# Patient Record
Sex: Male | Born: 1965 | Race: Black or African American | Hispanic: No | Marital: Married | Smoking: Never smoker
Health system: Southern US, Community
[De-identification: ages and names within clinical notes are randomized; demographics above are authoritative.]

## PROBLEM LIST (undated history)

## (undated) ENCOUNTER — Ambulatory Visit (HOSPITAL_COMMUNITY): Payer: No Typology Code available for payment source

## (undated) DIAGNOSIS — E785 Hyperlipidemia, unspecified: Secondary | ICD-10-CM

## (undated) DIAGNOSIS — I10 Essential (primary) hypertension: Secondary | ICD-10-CM

## (undated) HISTORY — DX: Hyperlipidemia, unspecified: E78.5

## (undated) HISTORY — DX: Essential (primary) hypertension: I10

---

## 1999-05-20 ENCOUNTER — Emergency Department (HOSPITAL_COMMUNITY): Admission: EM | Admit: 1999-05-20 | Discharge: 1999-05-20 | Payer: Self-pay | Admitting: Emergency Medicine

## 1999-05-20 ENCOUNTER — Encounter: Payer: Self-pay | Admitting: General Surgery

## 2001-06-29 ENCOUNTER — Emergency Department (HOSPITAL_COMMUNITY): Admission: EM | Admit: 2001-06-29 | Discharge: 2001-06-29 | Payer: Self-pay | Admitting: Emergency Medicine

## 2001-11-18 ENCOUNTER — Encounter: Payer: Self-pay | Admitting: Emergency Medicine

## 2001-11-18 ENCOUNTER — Emergency Department (HOSPITAL_COMMUNITY): Admission: EM | Admit: 2001-11-18 | Discharge: 2001-11-18 | Payer: Self-pay | Admitting: Emergency Medicine

## 2006-10-18 ENCOUNTER — Ambulatory Visit (HOSPITAL_BASED_OUTPATIENT_CLINIC_OR_DEPARTMENT_OTHER): Admission: RE | Admit: 2006-10-18 | Discharge: 2006-10-18 | Payer: Self-pay | Admitting: Psychiatry

## 2006-10-30 ENCOUNTER — Ambulatory Visit: Payer: Self-pay | Admitting: Internal Medicine

## 2010-08-18 NOTE — Procedures (Signed)
[name], [name]              ACCOUNT NO.:  [id number]   MEDICAL RECORD NO.:  [id number]          PATIENT TYPE:  OUT   LOCATION:  SLEEP CENTER                 FACILITY:  [facility]   PHYSICIAN:  Clinton D. Maple Hudson, MD, FCCP, FACPDATE OF BIRTH:  February 27, 1967   DATE OF STUDY:  10/18/2006                            NOCTURNAL POLYSOMNOGRAM   REFERRING PHYSICIAN:  Anne Fu, PA-C   INDICATION FOR STUDY:  Hypersomnia with sleep apnea.   EPWORTH SLEEPINESS SCORE:  11/24, BMI 30.3, weight 205 pounds.   MEDICATIONS:  Listed and reviewed.   SLEEP ARCHITECTURE:  Short total sleep time 172 minutes, sleep  efficiency 57%.  Stage I was 15%, stage II 54%, stage III and IV 27%,  RAM 6% of total sleep time.  Sleep latency 2 minutes, REM latency 44  minutes.  Awake after sleep onset 93 minutes.  Arousal index 25.5.  No  bedtime medication was taken.   RESPIRATORY DATA:  Diagnostic NPSG protocol requested.  Apnea/hypopnea  index (AHI, RDI), 3.5 obstructive events per hour which is within normal  limits (normal range 0-5 per hour).  There were 7 hypopneas and 3  obstructive apneas.  Events were associated with sleep in the supine and  left lateral positions.  REM AHI 18.9.   OXYGEN DATA:  Very loud snoring with oxygen desaturation to a nadir of  86%.  Mean oxygen saturation through the study was 96% on room air.   CARDIAC DATA:  Normal sinus rhythm.   MOVEMENT-PARASOMNIA:  It was described as extremely restless noting that  he asked to end the study at 2 a.m.  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  No significant limb movement  disorder was noted.   IMPRESSIONS-RECOMMENDATIONS:  1. 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 and 2 p.m. with no bedtime medication.  The patient      was described as extremely restless through the study, and he      requested that the study be ended with a short total sleep time of   172 minutes recorded.  Compare this to his home sleep habit.  2. Occasional sleep disorder breathing events, AHI 3.5 per hour      (normal range 0-5 per hour).  Very loud snoring      with oxygen desaturation transient leading to a nadir of 86%.      Normal mean oxygen saturation of 96% was otherwise maintained      through the study.      Clinton D. Maple Hudson, MD, [facility], FACP  Diplomate, Biomedical engineer of Sleep Medicine  Electronically Signed     CDY/MEDQ  D:  10/30/2006 08:41:23  T:  10/31/2006 06:34:33  Job:  [id number]

## 2017-08-12 ENCOUNTER — Other Ambulatory Visit: Payer: Self-pay | Admitting: Internal Medicine

## 2017-08-12 DIAGNOSIS — R55 Syncope and collapse: Secondary | ICD-10-CM

## 2017-08-17 ENCOUNTER — Other Ambulatory Visit: Payer: Self-pay

## 2018-05-26 ENCOUNTER — Other Ambulatory Visit: Payer: Self-pay

## 2018-05-26 MED ORDER — SPIRONOLACTONE 25 MG PO TABS: 25.0000 mg | ORAL_TABLET | Freq: Every day | ORAL | 1 refills | Status: DC

## 2018-10-23 ENCOUNTER — Other Ambulatory Visit: Payer: Self-pay | Admitting: Cardiology

## 2019-06-14 ENCOUNTER — Other Ambulatory Visit: Payer: Self-pay | Admitting: Cardiology

## 2019-09-04 ENCOUNTER — Other Ambulatory Visit: Payer: Self-pay | Admitting: Cardiology

## 2019-10-02 ENCOUNTER — Other Ambulatory Visit: Payer: Self-pay | Admitting: Cardiology

## 2019-12-17 ENCOUNTER — Other Ambulatory Visit: Payer: Self-pay | Admitting: Cardiology

## 2020-01-15 ENCOUNTER — Other Ambulatory Visit: Payer: Self-pay | Admitting: Cardiology

## 2020-11-04 ENCOUNTER — Other Ambulatory Visit: Payer: Self-pay

## 2020-11-04 ENCOUNTER — Ambulatory Visit: Payer: BC Managed Care – PPO | Admitting: Cardiology

## 2020-11-04 ENCOUNTER — Encounter: Payer: Self-pay | Admitting: Cardiology

## 2020-11-04 VITALS — BP 138/97 | HR 76 | Temp 98.4°F | Resp 17 | Ht 72.0 in | Wt 217.4 lb

## 2020-11-04 DIAGNOSIS — E785 Hyperlipidemia, unspecified: Secondary | ICD-10-CM

## 2020-11-04 DIAGNOSIS — R0789 Other chest pain: Secondary | ICD-10-CM

## 2020-11-04 DIAGNOSIS — I10 Essential (primary) hypertension: Secondary | ICD-10-CM

## 2020-11-04 DIAGNOSIS — R739 Hyperglycemia, unspecified: Secondary | ICD-10-CM

## 2020-11-04 DIAGNOSIS — R079 Chest pain, unspecified: Secondary | ICD-10-CM

## 2020-11-04 MED ORDER — EDARBYCLOR 40-25 MG PO TABS: 1.0000 | ORAL_TABLET | ORAL | 0 refills | Status: DC

## 2020-11-04 NOTE — Progress Notes (Signed)
Primary Physician/Referring:  [name], MD  Patient ID: Steven [name], male    DOB: 11-02-1965, 55 y.o.   MRN: [id number]  Chief Complaint  Patient presents with   Chest Pain   New Patient (Initial Visit)   HPI:    Steven [name]  is a 55 y.o. male patient from [place] with hypertension, hyperglycemia, mild hyperlipidemia, overweight and close to mild obesity, waist size of 40 inches presents for evaluation of chest pain.  His mother passed away 1 month ago after hip surgery and was told to have "heart issues".  For the past 2 to 3 weeks, he has noticed sharp chest pain on the left side of the chest that lasts brief moments.  He has not had any exertional chest discomfort or dyspnea or palpitations.  For the past 4 to 6 weeks he has not exercised but before this he has been running at least 3 to 6 miles once or twice a week and continues to walk regularly.   Past Medical History:  Diagnosis Date   Hypertension    History reviewed. No pertinent surgical history. Family History  Problem Relation Age of Onset   Heart attack Mother 34   Glaucoma Mother 3    Social History   Tobacco Use   Smoking status: Never   Smokeless tobacco: Never  Substance Use Topics   Alcohol use: Never   Marital Status: Married  ROS  Review of Systems  Cardiovascular:  Positive for chest pain. Negative for dyspnea on exertion and leg swelling.  Gastrointestinal:  Negative for melena.  Objective  Blood pressure (!) 138/97, pulse 76, temperature 98.4 F (36.9 C), temperature source Temporal, resp. rate 17, height 6' (1.829 m), weight 217 lb 6.4 oz (98.6 kg), SpO2 98 %. Body mass index is 29.48 kg/m.  Vitals with BMI 11/04/2020 11/04/2020  Height - _0   Weight - 217 lbs 6 oz  BMI - 40.10  Systolic 272 536  Diastolic 97 93  Pulse 76 81    Physical Exam Constitutional:      Appearance: He is obese.  Neck:     Vascular: No carotid bruit or JVD.  Cardiovascular:     Rate and  Rhythm: Normal rate and regular rhythm.     Pulses: Intact distal pulses.     Heart sounds: Normal heart sounds. No murmur heard.   No gallop.  Pulmonary:     Effort: Pulmonary effort is normal.     Breath sounds: Normal breath sounds.  Abdominal:     General: Bowel sounds are normal.     Palpations: Abdomen is soft.  Musculoskeletal:        General: No swelling.     Laboratory examination:   External labs:   Labs 08/23/2020:  Total cholesterol 215, triglycerides 125, HDL 66, LDL 127.  Serum glucose 80 mg, BUN 15, creatinine 1.41, EGFR 60 mL, potassium 4.6.  CMP otherwise normal.  Hb 14.1/HCT 40.5, platelets 341.  April protein be/A-1 ratio 0.7, normal.  A1c 6.2%.  TSH normal at 1.150.  Labs 02/05/2017: Serum glucose 90 mg, BUN 18, creatinine 1.37, eGFR 60/69 mL, potassium 4.1, CMP otherwise normal  11/16/2016: Vitamin D low at 11.3.  TSH 1.23.  T4 1 0.38.  Hemoglobin A1c 5.8%. Myeloperoxidase elevated at 560. Apolipo B/A-1 ratio 0.6.  CBC normal.  Creatinine 1.01, EGFR 86, potassium 4.0, alkaline phosphatase 31, CMP otherwise normal. LDL-P 1286, LDL-C 100, HDL 62, Triglycerides 131, Cholesterol 188, Small LDL-P  192, LP-IR score 30 Medications and allergies   Allergies  Allergen Reactions   Latex Swelling     Medication prior to this encounter:   Outpatient Medications Prior to Visit  Medication Sig Dispense Refill   ergocalciferol (VITAMIN D2) 1.25 MG (50000 UT) capsule Take 50,000 Units by mouth 2 (two) times a week.     spironolactone (ALDACTONE) 25 MG tablet TAKE 1 TABLET(25 MG) BY MOUTH DAILY. FOLLOW UP APPOINTMENT FOR FURTHER REFILLS 30 tablet 0   verapamil (VERELAN PM) 240 MG 24 hr capsule Take 1 capsule by mouth daily.     EDARBYCLOR 40-12.5 MG TABS Take 1 tablet by mouth daily.     No facility-administered medications prior to visit.    Medication list after today's encounter   Current Outpatient Medications  Medication Instructions    Azilsartan-Chlorthalidone (EDARBYCLOR) 40-25 MG TABS 1 tablet, Oral, BH-each morning   ergocalciferol (VITAMIN D2) 50,000 Units, Oral, 2 times weekly   spironolactone (ALDACTONE) 25 MG tablet TAKE 1 TABLET(25 MG) BY MOUTH DAILY. FOLLOW UP APPOINTMENT FOR FURTHER REFILLS   verapamil (VERELAN PM) 240 MG 24 hr capsule 1 capsule, Oral, Daily   Radiology:   No results found.  Cardiac Studies:   Echocardiogram 12/28/2016:  Left ventricle cavity is normal in size. Moderate concentric hypertrophy of the left ventricle. Normal global wall motion. Doppler evidence of grade I (impaired) diastolic dysfunction, normal LAP. Calculated EF 55%. Left atrial cavity is mildly dilated. Mild (Grade I) aortic regurgitation. Mild (Grade I) mitral regurgitation. Mild tricuspid regurgitation.  No evidence of pulmonary hypertension.  EKG:   EKG 11/04/2020: Normal sinus rhythm at rate of 83 bpm, normal axis.  Incomplete right bundle branch block.  Nonspecific T abnormality.  No significant change from EKG 11/26/2016.  Assessment     ICD-10-CM   1. Chest pain of uncertain etiology  Y40.3     2. Atypical chest pain  R07.89 EKG 12-Lead    CT CARDIAC SCORING ([facility] LOCATIONS ONLY)    [facility] ECHOCARDIOGRAM COMPLETE    3. Mild hyperlipidemia  E78.5     4. Hyperglycemia  R73.9     5. Primary hypertension  I10 Azilsartan-Chlorthalidone (EDARBYCLOR) 40-25 MG TABS      Medications Discontinued During This Encounter  Medication Reason   EDARBYCLOR 40-12.5 MG TABS Dose change    Meds ordered this encounter  Medications   Azilsartan-Chlorthalidone (EDARBYCLOR) 40-25 MG TABS    Sig: Take 1 tablet by mouth every morning.    Dispense:  90 tablet    Refill:  0   Orders Placed This Encounter  Procedures   CT CARDIAC SCORING ([facility] LOCATIONS ONLY)    Standing Status:   Future    Standing Expiration Date:   01/04/2021    Order Specific Question:   Preferred imaging location?    Answer:   [facility]   EKG 12-Lead   PC[facility]  ECHOCARDIOGRAM COMPLETE    Standing Status:   Future    Standing Expiration Date:   11/04/2021   Recommendations:   Steven [name] is a 55 y.o.male patient from [place] with hypertension, hyperglycemia, mild hyperlipidemia, overweight and close to mild obesity, waist size of 40 inches presents for evaluation of chest pain.  His mother passed away 1 month ago after hip surgery and was told to have "heart issues".  I suspect she had with pulmonary embolism.  His chest pain symptoms are clearly musculoskeletal lasting a few seconds, however he does have multiple cardiac risk factors and  so recommended a coronary calcium score for further restratification, his waist size is 40 inches, discussed risk associated with this including prediabetes and risk of CAD and hypertension being uncontrolled.  Increase Edarbichlor from 40/12.5 mg to 40/25 mg in the morning.  Lipids are not well controlled, does not need therapy unless coronary calcium score is abnormal, can be treated with lifestyle modification for now.  We will also obtain echocardiogram.  Could consider stress testing if coronary calcium score is high.  Advised that he continues to be active, on the weekends he runs anywhere from 3 to 6 miles without chest pain, and did not feel that stress testing is important at this point.  External records reviewed and updated.    Steven Prows, MD, [facility] 11/04/2020, 2:40 PM Office: [phone]

## 2020-11-10 ENCOUNTER — Ambulatory Visit: Payer: Self-pay | Admitting: Cardiology

## 2020-11-11 ENCOUNTER — Ambulatory Visit: Payer: BC Managed Care – PPO

## 2020-11-11 ENCOUNTER — Other Ambulatory Visit: Payer: Self-pay

## 2020-11-11 DIAGNOSIS — R0789 Other chest pain: Secondary | ICD-10-CM

## 2020-11-14 ENCOUNTER — Ambulatory Visit: Payer: Self-pay | Admitting: Cardiology

## 2020-11-28 ENCOUNTER — Other Ambulatory Visit: Payer: Self-pay

## 2020-11-28 ENCOUNTER — Ambulatory Visit
Admission: RE | Admit: 2020-11-28 | Discharge: 2020-11-28 | Disposition: A | Discharge status: Home / Self Care | Payer: No Typology Code available for payment source | Source: Ambulatory Visit | Attending: Cardiology | Admitting: Cardiology

## 2020-11-28 DIAGNOSIS — R0789 Other chest pain: Secondary | ICD-10-CM

## 2020-11-28 NOTE — Progress Notes (Signed)
Coronary calcium score 11/28/2020: LM 0 LAD 31 LCx 0 RCA 0 Total Agatston score 31. MESA database percentile: 82 Ascending and descending thoracic aorta are normal in size, no significant extracardiac abnormality.

## 2020-12-16 ENCOUNTER — Other Ambulatory Visit: Payer: Self-pay

## 2020-12-16 ENCOUNTER — Encounter: Payer: Self-pay | Admitting: Cardiology

## 2020-12-16 ENCOUNTER — Ambulatory Visit: Payer: BC Managed Care – PPO | Admitting: Cardiology

## 2020-12-16 VITALS — BP 136/89 | HR 79 | Temp 98.4°F | Resp 17 | Ht 72.0 in | Wt 217.0 lb

## 2020-12-16 DIAGNOSIS — E7800 Pure hypercholesterolemia, unspecified: Secondary | ICD-10-CM

## 2020-12-16 DIAGNOSIS — I10 Essential (primary) hypertension: Secondary | ICD-10-CM

## 2020-12-16 DIAGNOSIS — R739 Hyperglycemia, unspecified: Secondary | ICD-10-CM

## 2020-12-16 DIAGNOSIS — R931 Abnormal findings on diagnostic imaging of heart and coronary circulation: Secondary | ICD-10-CM

## 2020-12-16 MED ORDER — ATORVASTATIN CALCIUM 20 MG PO TABS
20.0000 mg | ORAL_TABLET | Freq: Every day | ORAL | 3 refills | Status: DC
Start: 2020-12-16 — End: 2021-03-17

## 2020-12-16 MED ORDER — ATENOLOL 50 MG PO TABS: 50.0000 mg | ORAL_TABLET | ORAL | 3 refills | Status: DC

## 2020-12-16 NOTE — Progress Notes (Signed)
Primary Physician/Referring:  [name], MD  Patient ID: Steven [name], male    DOB: April 15, 1965, 55 y.o.   MRN: [id number]  Chief Complaint  Patient presents with   Hypertension    6 weeks   Hyperlipidemia   Results   HPI:    Steven [name]  is a 55 y.o. male patient from [place] with hypertension, hyperglycemia, mild hyperlipidemia, overweight and close to mild obesity, waist size of 40 inches presents for evaluation of chest pain.  I had seen him 6 to 8 weeks ago.  I felt the chest pain was musculoskeletal, he does run between 3 to 6 miles on the weekends without any chest pain.  He underwent coronary calcium score and presents for follow-up.  States that he has not had any further significant chest pains and he has continued to remain active.  Past Medical History:  Diagnosis Date   Hypertension    History reviewed. No pertinent surgical history. Family History  Problem Relation Age of Onset   Heart attack Mother 50   Glaucoma Mother 78    Social History   Tobacco Use   Smoking status: Never   Smokeless tobacco: Never  Substance Use Topics   Alcohol use: Never   Marital Status: Married  ROS  Review of Systems  Cardiovascular:  Negative for chest pain, dyspnea on exertion and leg swelling.  Gastrointestinal:  Negative for melena.  Objective  Blood pressure 136/89, pulse 79, temperature 98.4 F (36.9 C), temperature source Temporal, resp. rate 17, height 6' (1.829 m), weight 217 lb (98.4 kg), SpO2 98 %. Body mass index is 29.43 kg/m.  Vitals with BMI 12/16/2020 12/16/2020 11/04/2020  Height - 6' 0"  -  Weight - 217 lbs -  BMI - 11.91 -  Systolic 478 295 621  Diastolic 89 93 97  Pulse 79 79 76    Physical Exam Constitutional:      Appearance: He is obese.  Neck:     Vascular: No carotid bruit or JVD.  Cardiovascular:     Rate and Rhythm: Normal rate and regular rhythm.     Pulses: Intact distal pulses.     Heart sounds: Normal heart sounds. No  murmur heard.   No gallop.  Pulmonary:     Effort: Pulmonary effort is normal.     Breath sounds: Normal breath sounds.  Abdominal:     General: Bowel sounds are normal.     Palpations: Abdomen is soft.  Musculoskeletal:        General: No swelling.     Laboratory examination:   External labs:   Labs 08/23/2020:  Total cholesterol 215, triglycerides 125, HDL 66, LDL 127.  Serum glucose 80 mg, BUN 15, creatinine 1.41, EGFR 60 mL, potassium 4.6.  CMP otherwise normal.  Hb 14.1/HCT 40.5, platelets 341.  April protein be/A-1 ratio 0.7, normal.  A1c 6.2%.  TSH normal at 1.150.  Labs 02/05/2017: Serum glucose 90 mg, BUN 18, creatinine 1.37, eGFR 60/69 mL, potassium 4.1, CMP otherwise normal  11/16/2016: Vitamin D low at 11.3.  TSH 1.23.  T4 1 0.38.  Hemoglobin A1c 5.8%. Myeloperoxidase elevated at 560. Apolipo B/A-1 ratio 0.6.  CBC normal.  Creatinine 1.01, EGFR 86, potassium 4.0, alkaline phosphatase 31, CMP otherwise normal. LDL-P 1286, LDL-C 100, HDL 62, Triglycerides 131, Cholesterol 188, Small LDL-P 192, LP-IR score 30 Medications and allergies   Allergies  Allergen Reactions   Latex Swelling    Medication prior to this encounter:  Outpatient Medications Prior to Visit  Medication Sig Dispense Refill   Azilsartan-Chlorthalidone (EDARBYCLOR) 40-25 MG TABS Take 1 tablet by mouth every morning. 90 tablet 0   ergocalciferol (VITAMIN D2) 1.25 MG (50000 UT) capsule Take 50,000 Units by mouth 2 (two) times a week.     spironolactone (ALDACTONE) 25 MG tablet TAKE 1 TABLET(25 MG) BY MOUTH DAILY. FOLLOW UP APPOINTMENT FOR FURTHER REFILLS 30 tablet 0   verapamil (VERELAN PM) 240 MG 24 hr capsule Take 1 capsule by mouth daily.     No facility-administered medications prior to visit.    Medication list after today's encounter   Current Outpatient Medications  Medication Instructions   atenolol (TENORMIN) 50 mg, Oral, BH-each morning   atorvastatin (LIPITOR) 20 mg, Oral, Daily    Azilsartan-Chlorthalidone (EDARBYCLOR) 40-25 MG TABS 1 tablet, Oral, BH-each morning   ergocalciferol (VITAMIN D2) 50,000 Units, Oral, 2 times weekly   spironolactone (ALDACTONE) 25 MG tablet TAKE 1 TABLET(25 MG) BY MOUTH DAILY. FOLLOW UP APPOINTMENT FOR FURTHER REFILLS   verapamil (VERELAN PM) 240 MG 24 hr capsule 1 capsule, Oral, Daily   Radiology:   No results found.  Cardiac Studies:   Echocardiogram 12/28/2016:  Left ventricle cavity is normal in size. Moderate concentric hypertrophy of the left ventricle. Normal global wall motion. Doppler evidence of grade I (impaired) diastolic dysfunction, normal LAP. Calculated EF 55%. Left atrial cavity is mildly dilated. Mild (Grade I) aortic regurgitation. Mild (Grade I) mitral regurgitation. Mild tricuspid regurgitation.  No evidence of pulmonary hypertension.  Coronary calcium score 11/28/2020: LM 0 LAD 31 LCx 0 RCA 0 Total Agatston score 31. MESA database percentile: 82 Ascending and descending thoracic aorta are normal in size, no significant extracardiac abnormality.  EKG:   EKG 11/04/2020: Normal sinus rhythm at rate of 83 bpm, normal axis.  Incomplete right bundle branch block.  Nonspecific T abnormality.  No significant change from EKG 11/26/2016.  Assessment     ICD-10-CM   1. Hypercholesteremia  E78.00 atorvastatin (LIPITOR) 20 MG tablet    Lipid Panel With LDL/HDL Ratio    2. Agatston CAC score, <100, 89th percentile. Sept 2022  R93.1 atorvastatin (LIPITOR) 20 MG tablet    3. Hyperglycemia  R73.9 atenolol (TENORMIN) 50 MG tablet    4. Primary hypertension  I10       There are no discontinued medications.   Meds ordered this encounter  Medications   atorvastatin (LIPITOR) 20 MG tablet    Sig: Take 1 tablet (20 mg total) by mouth daily.    Dispense:  30 tablet    Refill:  3   atenolol (TENORMIN) 50 MG tablet    Sig: Take 1 tablet (50 mg total) by mouth every morning.    Dispense:  30 tablet    Refill:  3    Orders Placed This Encounter  Procedures   Lipid Panel With LDL/HDL Ratio   Recommendations:   Steven [name] is a 55 y.o.male patient from [place] with hypertension, hyperglycemia, mild hyperlipidemia, overweight and close to mild obesity, waist size of 40 inches presents for evaluation of chest pain.  I had seen him 6 to 8 weeks ago.  I felt the chest pain was musculoskeletal, he does run between 3 to 6 miles on the weekends without any chest pain.  I reviewed the results of the coronary calcium score which places him at the 89 percentile.  In view of this, hypertension, hyperglycemia, mild obesity, I have recommended that he go on a  statin with 20 mg of atorvastatin daily and recheck his lipids in 3 months.  Blood pressure still remains elevated, I will increase the dose of Edarbi chlor from 40/12.5 mg to 40/25 mg which he is tolerating.  I will add atenolol 50 mg daily.  I suspect with weight loss he probably may be able to come off of 1 of these medications.  I would like to see him back again in 3 months for follow-up of hypertension as well.  I have discussed with him regarding his waist size of >40, mild hyperglycemia, hyperlipidemia and hypertension as risk factors.  Along with coronary calcium score which is fairly high.  He appears motivated. His chest pain symptoms are clearly musculoskeletal lasting a few seconds, however he does have multiple cardiac risk factors and so recommended a coronary calcium score for further restratification, his waist size is 40 inches, discussed risk associated with this including prediabetes and risk of CAD and hypertension being uncontrolled.  Increase Edarbichlor from 40/12.5 mg to 40/25 mg in the morning.  Lipids are not well controlled, does not need therapy unless coronary calcium score is abnormal, can be treated with lifestyle modification for now.  We will also obtain echocardiogram.  Could consider stress testing if coronary calcium score  is high.  Advised that he continues to be active, on the weekends he runs anywhere from 3 to 6 miles without chest pain, and did not feel that stress testing is important at this point.  External records reviewed and updated.  If he remains stable on his next office visit, I will see him back on a as needed basis.   Adrian Prows, MD, [facility] 12/16/2020, 3:43 PM Office: [phone]

## 2021-03-11 ENCOUNTER — Other Ambulatory Visit: Payer: Self-pay

## 2021-03-11 ENCOUNTER — Other Ambulatory Visit: Payer: Self-pay | Admitting: Podiatry

## 2021-03-11 ENCOUNTER — Ambulatory Visit (INDEPENDENT_AMBULATORY_CARE_PROVIDER_SITE_OTHER): Payer: BC Managed Care – PPO

## 2021-03-11 ENCOUNTER — Ambulatory Visit: Payer: BC Managed Care – PPO | Admitting: Podiatry

## 2021-03-11 DIAGNOSIS — M79671 Pain in right foot: Secondary | ICD-10-CM

## 2021-03-11 DIAGNOSIS — M79672 Pain in left foot: Secondary | ICD-10-CM | POA: Diagnosis not present

## 2021-03-11 DIAGNOSIS — M7661 Achilles tendinitis, right leg: Secondary | ICD-10-CM | POA: Diagnosis not present

## 2021-03-11 DIAGNOSIS — M7662 Achilles tendinitis, left leg: Secondary | ICD-10-CM | POA: Diagnosis not present

## 2021-03-13 ENCOUNTER — Telehealth: Payer: Self-pay | Admitting: Podiatry

## 2021-03-13 NOTE — Telephone Encounter (Signed)
Patient stated that there was some pain meds that was suppose to be sent to his pharmacy for pain.  Please advise

## 2021-03-17 ENCOUNTER — Encounter: Payer: Self-pay | Admitting: Cardiology

## 2021-03-17 ENCOUNTER — Ambulatory Visit: Payer: BC Managed Care – PPO | Admitting: Cardiology

## 2021-03-17 ENCOUNTER — Other Ambulatory Visit: Payer: Self-pay

## 2021-03-17 ENCOUNTER — Encounter: Payer: Self-pay | Admitting: Podiatry

## 2021-03-17 ENCOUNTER — Other Ambulatory Visit: Payer: Self-pay | Admitting: Podiatry

## 2021-03-17 VITALS — BP 148/95 | HR 70 | Temp 98.5°F | Resp 17 | Ht 72.0 in | Wt 205.6 lb

## 2021-03-17 DIAGNOSIS — I10 Essential (primary) hypertension: Secondary | ICD-10-CM

## 2021-03-17 DIAGNOSIS — M7662 Achilles tendinitis, left leg: Secondary | ICD-10-CM

## 2021-03-17 DIAGNOSIS — M7661 Achilles tendinitis, right leg: Secondary | ICD-10-CM

## 2021-03-17 DIAGNOSIS — R931 Abnormal findings on diagnostic imaging of heart and coronary circulation: Secondary | ICD-10-CM

## 2021-03-17 DIAGNOSIS — R739 Hyperglycemia, unspecified: Secondary | ICD-10-CM

## 2021-03-17 DIAGNOSIS — E7800 Pure hypercholesterolemia, unspecified: Secondary | ICD-10-CM

## 2021-03-17 MED ORDER — ATORVASTATIN CALCIUM 20 MG PO TABS: 20.0000 mg | ORAL_TABLET | Freq: Every day | ORAL | 1 refills | Status: DC

## 2021-03-17 MED ORDER — OXYCODONE-ACETAMINOPHEN 7.5-325 MG PO TABS
1.0000 | ORAL_TABLET | ORAL | 0 refills | Status: DC | PRN
Start: 2021-03-17 — End: 2021-06-22

## 2021-03-17 MED ORDER — MELOXICAM 15 MG PO TABS: 15.0000 mg | ORAL_TABLET | Freq: Every day | ORAL | 0 refills | Status: DC

## 2021-03-17 MED ORDER — METHYLPREDNISOLONE 4 MG PO TBPK: ORAL_TABLET | ORAL | 0 refills | Status: DC

## 2021-03-17 MED ORDER — ATENOLOL 50 MG PO TABS: 50.0000 mg | ORAL_TABLET | ORAL | 1 refills | Status: DC

## 2021-03-17 MED ORDER — EDARBYCLOR 40-25 MG PO TABS
1.0000 | ORAL_TABLET | ORAL | 1 refills | Status: AC
Start: 2021-03-17 — End: ?

## 2021-03-17 NOTE — Addendum Note (Signed)
Addended by: Nicholes Rough on: 03/17/2021 11:59 AM   Modules accepted: Orders

## 2021-03-17 NOTE — Progress Notes (Signed)
Primary Physician/Referring:  [name], MD  Patient ID: Steven [name], male    DOB: 03-23-1966, 55 y.o.   MRN: [id number]  Chief Complaint  Patient presents with   Hyperlipidemia   Hypertension    3 MONTH   HPI:    Steven [name]  is a 55 y.o. male patient from [place] with hypertension, hyperglycemia, mild hyperlipidemia, overweight and close to mild obesity, waist size of 40 inches presents for evaluation of chest pain.  I had seen him 6 to 8 weeks ago.  I felt the chest pain was musculoskeletal, he does run between 3 to 6 miles on the weekends without any chest pain.  In view of his underlying cardiovascular risk factors including elevated coronary calcium score, hypertension and hyperglycemia, I have started him on atenolol 50 mg daily, atorvastatin 20 mg daily and I will increase the dose of Edarbi chlor from 40/12.5 mg to 40/25 mg in the morning.  Interim he has developed severe bilateral Achilles tendon tendinitis about 2 weeks ago and has had significant amount of difficulty to do anything including activities of daily living without significant pain.  He was evaluated by podiatry and steroid therapy was recommended at a later date.  Patient states that she was tolerating all the medications that are prescribed however he ran out of the prescriptions about 2 to 3 weeks ago.  He has also started to lose weight by making lifestyle changes and has lost about 12 pounds in weight.  Past Medical History:  Diagnosis Date   Hypertension    History reviewed. No pertinent surgical history. Family History  Problem Relation Age of Onset   Heart attack Mother 60   Glaucoma Mother 34    Social History   Tobacco Use   Smoking status: Never   Smokeless tobacco: Never  Substance Use Topics   Alcohol use: Never   Marital Status: Married  ROS  Review of Systems  Cardiovascular:  Negative for chest pain, dyspnea on exertion and leg swelling.  Gastrointestinal:  Negative  for melena.  Objective  Blood pressure (!) 148/95, pulse 70, temperature 98.5 F (36.9 C), temperature source Temporal, resp. rate 17, height 6' (1.829 m), weight 205 lb 9.6 oz (93.3 kg), SpO2 99 %. Body mass index is 27.88 kg/m.  Vitals with BMI 03/17/2021 12/16/2020 12/16/2020  Height _0  - _1   Weight 205 lbs 10 oz - 217 lbs  BMI 57.32 - 20.25  Systolic 427 062 376  Diastolic 95 89 93  Pulse 70 79 79    Physical Exam Constitutional:      Appearance: He is obese.  Neck:     Vascular: No carotid bruit or JVD.  Cardiovascular:     Rate and Rhythm: Normal rate and regular rhythm.     Pulses: Intact distal pulses.     Heart sounds: Normal heart sounds. No murmur heard.   No gallop.  Pulmonary:     Effort: Pulmonary effort is normal.     Breath sounds: Normal breath sounds.  Abdominal:     General: Bowel sounds are normal.     Palpations: Abdomen is soft.  Musculoskeletal:        General: No swelling.     Laboratory examination:   External labs:   Labs 08/23/2020:  Total cholesterol 215, triglycerides 125, HDL 66, LDL 127.  Serum glucose 80 mg, BUN 15, creatinine 1.41, EGFR 60 mL, potassium 4.6.  CMP otherwise normal.  Hb 14.1/HCT 40.5,  platelets 341.  April protein be/A-1 ratio 0.7, normal.  A1c 6.2%.  TSH normal at 1.150.  Labs 02/05/2017: Serum glucose 90 mg, BUN 18, creatinine 1.37, eGFR 60/69 mL, potassium 4.1, CMP otherwise normal  11/16/2016: Vitamin D low at 11.3.  TSH 1.23.  T4 1 0.38.  Hemoglobin A1c 5.8%. Myeloperoxidase elevated at 560. Apolipo B/A-1 ratio 0.6.  CBC normal.  Creatinine 1.01, EGFR 86, potassium 4.0, alkaline phosphatase 31, CMP otherwise normal. LDL-P 1286, LDL-C 100, HDL 62, Triglycerides 131, Cholesterol 188, Small LDL-P 192, LP-IR score 30 Medications and allergies   Allergies  Allergen Reactions   Latex Swelling    Medication prior to this encounter:   Outpatient Medications Prior to Visit  Medication Sig Dispense Refill    ergocalciferol (VITAMIN D2) 1.25 MG (50000 UT) capsule Take 50,000 Units by mouth 2 (two) times a week.     spironolactone (ALDACTONE) 25 MG tablet TAKE 1 TABLET(25 MG) BY MOUTH DAILY. FOLLOW UP APPOINTMENT FOR FURTHER REFILLS 30 tablet 0   verapamil (VERELAN PM) 240 MG 24 hr capsule Take 1 capsule by mouth daily.     Azilsartan-Chlorthalidone (EDARBYCLOR) 40-25 MG TABS Take 1 tablet by mouth every morning. 90 tablet 0   atenolol (TENORMIN) 50 MG tablet Take 1 tablet (50 mg total) by mouth every morning. 30 tablet 3   atorvastatin (LIPITOR) 20 MG tablet Take 1 tablet (20 mg total) by mouth daily. 30 tablet 3   meloxicam (MOBIC) 15 MG tablet Take 1 tablet (15 mg total) by mouth daily. 30 tablet 0   methylPREDNISolone (MEDROL DOSEPAK) 4 MG TBPK tablet Use as directed 1 each 0   No facility-administered medications prior to visit.    Medication list after today's encounter   Current Outpatient Medications  Medication Instructions   atenolol (TENORMIN) 50 mg, Oral, BH-each morning   atorvastatin (LIPITOR) 20 mg, Oral, Daily   Azilsartan-Chlorthalidone (EDARBYCLOR) 40-25 MG TABS 1 tablet, Oral, BH-each morning   ergocalciferol (VITAMIN D2) 50,000 Units, Oral, 2 times weekly   oxyCODONE-acetaminophen (PERCOCET) 7.5-325 MG tablet 1 tablet, Oral, Every 4 hours PRN   spironolactone (ALDACTONE) 25 MG tablet TAKE 1 TABLET(25 MG) BY MOUTH DAILY. FOLLOW UP APPOINTMENT FOR FURTHER REFILLS   verapamil (VERELAN PM) 240 MG 24 hr capsule 1 capsule, Oral, Daily   Radiology:   No results found.  Cardiac Studies:   Echocardiogram 12/28/2016:  Left ventricle cavity is normal in size. Moderate concentric hypertrophy of the left ventricle. Normal global wall motion. Doppler evidence of grade I (impaired) diastolic dysfunction, normal LAP. Calculated EF 55%. Left atrial cavity is mildly dilated. Mild (Grade I) aortic regurgitation. Mild (Grade I) mitral regurgitation. Mild tricuspid regurgitation.  No  evidence of pulmonary hypertension.  Coronary calcium score 11/28/2020: LM 0 LAD 31 LCx 0 RCA 0 Total Agatston score 31. MESA database percentile: 82 Ascending and descending thoracic aorta are normal in size, no significant extracardiac abnormality.  EKG:   EKG 11/04/2020: Normal sinus rhythm at rate of 83 bpm, normal axis.  Incomplete right bundle branch block.  Nonspecific T abnormality.  No significant change from EKG 11/26/2016.  Assessment     ICD-10-CM   1. Agatston CAC score, <100, 89th percentile. Sept 2022  R93.1 atorvastatin (LIPITOR) 20 MG tablet    2. Primary hypertension  I10 atenolol (TENORMIN) 50 MG tablet    Azilsartan-Chlorthalidone (EDARBYCLOR) 40-25 MG TABS    3. Hypercholesteremia  E78.00 atorvastatin (LIPITOR) 20 MG tablet    4. Hyperglycemia  R73.9  5. Achilles tendinitis of both lower extremities  M76.61 oxyCODONE-acetaminophen (PERCOCET) 7.5-325 MG tablet   M76.62       Medications Discontinued During This Encounter  Medication Reason   atorvastatin (LIPITOR) 20 MG tablet    atenolol (TENORMIN) 50 MG tablet    meloxicam (MOBIC) 15 MG tablet    methylPREDNISolone (MEDROL DOSEPAK) 4 MG TBPK tablet    Azilsartan-Chlorthalidone (EDARBYCLOR) 40-25 MG TABS Reorder     Meds ordered this encounter  Medications   atorvastatin (LIPITOR) 20 MG tablet    Sig: Take 1 tablet (20 mg total) by mouth daily.    Dispense:  90 tablet    Refill:  1   atenolol (TENORMIN) 50 MG tablet    Sig: Take 1 tablet (50 mg total) by mouth every morning.    Dispense:  90 tablet    Refill:  1   Azilsartan-Chlorthalidone (EDARBYCLOR) 40-25 MG TABS    Sig: Take 1 tablet by mouth every morning.    Dispense:  90 tablet    Refill:  1   oxyCODONE-acetaminophen (PERCOCET) 7.5-325 MG tablet    Sig: Take 1 tablet by mouth every 4 (four) hours as needed for severe pain.    Dispense:  30 tablet    Refill:  0    No orders of the defined types were placed in this  encounter.  Recommendations:   Nikholas D Ashmead is a 55 y.o.male patient from [place] with hypertension, hyperglycemia, mild hyperlipidemia, overweight and close to mild obesity, waist size of 40 inches presents for evaluation of chest pain.  I had seen him 6 to 8 weeks ago.  I felt the chest pain was musculoskeletal, he does run between 3 to 6 miles on the weekends without any chest pain.    In view of his underlying cardiovascular risk factors including elevated coronary calcium score, hypertension and hyperglycemia, I have started him on atenolol 50 mg daily, atorvastatin 20 mg daily and I will increase the dose of Edarbi chlor from 40/12.5 mg to 40/25 mg in the morning.  He tolerated the medications without side effects but he ran out of the medication about 2 to 3 weeks ago.  I refilled the prescription.  He will obtain lipid profile testing in 1 month after starting the medications back.  He has developed acute tendinitis of bilateral Achilles tendon, he is in severe pain.  Prescribed him Percocet 30 tablets with no refills to be taken on a as needed basis.  He is aware of drowsiness and constipation as side effects.  Although statins can induce tendinitis, patient has had tendinitis in the past even prior to being on a statin.  However this needs to be kept in mind.  I would like to see him back in 3 months for follow-up of hypertension and hyperlipidemia.  If he remains stable I will see him back on a as needed basis.  Since last office visit he has lost weight and is started to feel well, and has made lifestyle changes and diet changes at home. Adrian Prows, MD, [facility] 03/17/2021, [place] PM Office: [phone]

## 2021-03-17 NOTE — Progress Notes (Signed)
Subjective:  Patient ID: Steven [name], male    DOB: 06/26/1965,  MRN: [id number]  No chief complaint on file.   55 y.o. male presents with the above complaint.  Patient presents with complaint bilateral posterior heel pain right greater than left side.  Patient states has progressed to gotten worse as it hurts to work.  He states it hurts to walk on the foot.  He has not seen anyone else prior to see me.  He has not done any conservative treatment options and would like to discuss options for this.  He has not taken any medication.  He denies any other acute complaints.   Review of Systems: Negative except as noted in the HPI. Denies N/V/F/Ch.  Past Medical History:  Diagnosis Date   Hypertension     Current Outpatient Medications:    atenolol (TENORMIN) 50 MG tablet, Take 1 tablet (50 mg total) by mouth every morning., Disp: 30 tablet, Rfl: 3   atorvastatin (LIPITOR) 20 MG tablet, Take 1 tablet (20 mg total) by mouth daily., Disp: 30 tablet, Rfl: 3   Azilsartan-Chlorthalidone (EDARBYCLOR) 40-25 MG TABS, Take 1 tablet by mouth every morning., Disp: 90 tablet, Rfl: 0   ergocalciferol (VITAMIN D2) 1.25 MG (50000 UT) capsule, Take 50,000 Units by mouth 2 (two) times a week., Disp: , Rfl:    meloxicam (MOBIC) 15 MG tablet, Take 1 tablet (15 mg total) by mouth daily., Disp: 30 tablet, Rfl: 0   methylPREDNISolone (MEDROL DOSEPAK) 4 MG TBPK tablet, Use as directed, Disp: 1 each, Rfl: 0   spironolactone (ALDACTONE) 25 MG tablet, TAKE 1 TABLET(25 MG) BY MOUTH DAILY. FOLLOW UP APPOINTMENT FOR FURTHER REFILLS, Disp: 30 tablet, Rfl: 0   verapamil (VERELAN PM) 240 MG 24 hr capsule, Take 1 capsule by mouth daily., Disp: , Rfl:   Social History   Tobacco Use  Smoking Status Never  Smokeless Tobacco Never    Allergies  Allergen Reactions   Latex Swelling   Objective:  There were no vitals filed for this visit. There is no height or weight on file to calculate BMI. Constitutional Well  developed. Well nourished.  Vascular Dorsalis pedis pulses palpable bilaterally. Posterior tibial pulses palpable bilaterally. Capillary refill normal to all digits.  No cyanosis or clubbing noted. Pedal hair growth normal.  Neurologic Normal speech. Oriented to person, place, and time. Epicritic sensation to light touch grossly present bilaterally.  Dermatologic Nails well groomed and normal in appearance. No open wounds. No skin lesions.  Orthopedic: Pain on palpation bilateral Achilles tendon right greater than left side.  Pain at the insertion of the Achilles tendon and Haglund's deformity clinically appreciated.  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   Radiographs:  3 views of skeletally mature adult bilateral foot bilateral posterior heel spurring noted.  Haglund's deformity noted.  No other bony abnormalities identified. Assessment:   1. Right Achilles tendinitis   2. Left Achilles tendinitis    Plan:  Patient was evaluated and treated and all questions answered.  Bilateral Achilles tendinitis right greater than left with underlying Haglund's deformity with underlying gastrocnemius equinus -I explained to the patient the etiology of Achilles tendinitis and various treatment options were discussed -Given the amount of pain that is having I believe he will benefit from cam boot immobilization to the right side and ankle brace to the left side. -Cam boot was dispensed to the right -Ankle brace was dispensed to the left -If there is no improvement we will discuss cam  boot immobilization versus MRI versus injections  No follow-ups on file.

## 2021-03-17 NOTE — Telephone Encounter (Signed)
Please advise 

## 2021-03-25 ENCOUNTER — Ambulatory Visit: Payer: Self-pay | Admitting: Podiatry

## 2021-04-08 ENCOUNTER — Ambulatory Visit: Payer: BC Managed Care – PPO | Admitting: Podiatry

## 2021-04-13 ENCOUNTER — Other Ambulatory Visit: Payer: Self-pay | Admitting: Podiatry

## 2021-04-13 NOTE — Telephone Encounter (Signed)
Please advise 

## 2021-04-14 ENCOUNTER — Other Ambulatory Visit: Payer: Self-pay | Admitting: Cardiology

## 2021-04-14 DIAGNOSIS — I10 Essential (primary) hypertension: Secondary | ICD-10-CM

## 2021-04-14 DIAGNOSIS — R931 Abnormal findings on diagnostic imaging of heart and coronary circulation: Secondary | ICD-10-CM

## 2021-04-14 DIAGNOSIS — E7800 Pure hypercholesterolemia, unspecified: Secondary | ICD-10-CM

## 2021-06-22 ENCOUNTER — Ambulatory Visit: Payer: BC Managed Care – PPO | Admitting: Cardiology

## 2021-06-22 ENCOUNTER — Encounter: Payer: Self-pay | Admitting: Cardiology

## 2021-06-22 ENCOUNTER — Other Ambulatory Visit: Payer: Self-pay

## 2021-06-22 VITALS — BP 122/67 | HR 77 | Temp 97.9°F | Resp 14 | Ht 72.0 in | Wt 205.6 lb

## 2021-06-22 DIAGNOSIS — R739 Hyperglycemia, unspecified: Secondary | ICD-10-CM

## 2021-06-22 DIAGNOSIS — R931 Abnormal findings on diagnostic imaging of heart and coronary circulation: Secondary | ICD-10-CM

## 2021-06-22 DIAGNOSIS — E7800 Pure hypercholesterolemia, unspecified: Secondary | ICD-10-CM

## 2021-06-22 DIAGNOSIS — I10 Essential (primary) hypertension: Secondary | ICD-10-CM

## 2021-06-22 NOTE — Progress Notes (Signed)
? ?Primary Physician/Referring:  [name], MD ? ?Patient ID: Steven [name], male    DOB: [birth year], 56 y.o.   MRN: [id number] ? ?Chief Complaint  ?Patient presents with  ? Hypertension  ? Hyperlipidemia  ? Follow-up  ?  3 months  ? ?HPI:   ? ?Steven [name]  is a 56 y.o. male patient from [place] with hypertension, hyperglycemia, mild hyperlipidemia, overweight, elevated coronary calcium score, And seen him 3 months ago and he had developed Achilles tendinitis bilaterally, blood pressure was elevated he now presents for follow-up.  He is asymptomatic.  He has maintained weight loss. ? ?Past Medical History:  ?Diagnosis Date  ? Hyperlipidemia   ? Hypertension   ? ?History reviewed. No pertinent surgical history. ?Family History  ?Problem Relation Age of Onset  ? Heart attack Mother 11  ? Glaucoma Mother 34  ?  ?Social History  ? ?Tobacco Use  ? Smoking status: Never  ? Smokeless tobacco: Never  ?Substance Use Topics  ? Alcohol use: Never  ? ?Marital Status: Married  ?ROS  ?Review of Systems  ?Cardiovascular:  Negative for chest pain, dyspnea on exertion and leg swelling.  ?Gastrointestinal:  Negative for melena.  ?Objective  ?Blood pressure 122/67, pulse 77, temperature 97.9 ?F (36.6 ?C), temperature source Temporal, resp. rate 14, height 6' (1.829 m), weight 205 lb 9.6 oz (93.3 kg), SpO2 98 %. Body mass index is 27.88 kg/m?.  ?Vitals with BMI 06/22/2021 03/17/2021 12/16/2020  ?Height 6' 0"  6' 0"  -  ?Weight 205 lbs 10 oz 205 lbs 10 oz -  ?BMI 27.88 27.88 -  ?Systolic 092 330 076  ?Diastolic 67 95 89  ?Pulse 77 70 79  ?  ?Physical Exam ?Neck:  ?   Vascular: No carotid bruit or JVD.  ?Cardiovascular:  ?   Rate and Rhythm: Normal rate and regular rhythm.  ?   Pulses: Intact distal pulses.  ?   Heart sounds: Normal heart sounds. No murmur heard. ?  No gallop.  ?Pulmonary:  ?   Effort: Pulmonary effort is normal.  ?   Breath sounds: Normal breath sounds.  ?Abdominal:  ?   General: Bowel sounds are normal.   ?   Palpations: Abdomen is soft.  ?Musculoskeletal:  ?   Right lower leg: No edema.  ?   Left lower leg: No edema.  ?  ?Laboratory examination:  ? ?External labs:  ? ?Labs 08/23/2020: ? ?Total cholesterol 215, triglycerides 125, HDL 66, LDL 127. ? ?Serum glucose 80 mg, BUN 15, creatinine 1.41, EGFR 60 mL, potassium 4.6.  CMP otherwise normal. ? ?Hb 14.1/HCT 40.5, platelets 341. ? ?April protein B/A-1 ratio 0.7, normal. ? ?A1c 6.2%.  TSH normal at 1.150. ? ?Medications and allergies  ? ?Allergies  ?Allergen Reactions  ? Latex Swelling  ?  ?Medication list after today's encounter  ? ? ?Current Outpatient Medications:  ?  atenolol (TENORMIN) 50 MG tablet, TAKE 1 TABLET(50 MG) BY MOUTH EVERY MORNING, Disp: 90 tablet, Rfl: 0 ?  atorvastatin (LIPITOR) 20 MG tablet, TAKE 1 TABLET(20 MG) BY MOUTH DAILY, Disp: 90 tablet, Rfl: 0 ?  Azilsartan-Chlorthalidone (EDARBYCLOR) 40-25 MG TABS, Take 1 tablet by mouth every morning., Disp: 90 tablet, Rfl: 1 ?  spironolactone (ALDACTONE) 25 MG tablet, TAKE 1 TABLET(25 MG) BY MOUTH DAILY. FOLLOW UP APPOINTMENT FOR FURTHER REFILLS, Disp: 30 tablet, Rfl: 0 ?  verapamil (VERELAN PM) 240 MG 24 hr capsule, Take 1 capsule by mouth daily., Disp: , Rfl:   ?  Radiology:  ? ?No results found. ? ?Cardiac Studies:  ? ?Echocardiogram 12/28/2016:  ?Left ventricle cavity is normal in size. Moderate concentric hypertrophy of the left ventricle. Normal global wall motion. Doppler evidence of grade I (impaired) diastolic dysfunction, normal LAP. Calculated EF 55%. ?Left atrial cavity is mildly dilated. ?Mild (Grade I) aortic regurgitation. ?Mild (Grade I) mitral regurgitation. ?Mild tricuspid regurgitation.  ?No evidence of pulmonary hypertension. ? ?Coronary calcium score 11/28/2020: ?LM 0 ?LAD 31 ?LCx 0 ?RCA 0 ?Total Agatston score 31. MESA database percentile: 32 ?Ascending and descending thoracic aorta are normal in size, no significant extracardiac abnormality. ? ?EKG:  ? ?EKG 06/22/2021: Normal sinus  rhythm at the rate of 58 bpm, left atrial enlargement, normal axis.  Nonspecific T abnormality. No significant change from EKG 11/04/2020:  ? ?Assessment  ? ?  ICD-10-CM   ?1. Primary hypertension  I10 EKG 12-Lead  ?  ?2. Agatston CAC score, <100, 89th percentile. Sept 2022  R93.1   ?  ?3. Hypercholesteremia  E78.00   ?  ?4. Hyperglycemia  R73.9   ?  ?  ?Medications Discontinued During This Encounter  ?Medication Reason  ? ergocalciferol (VITAMIN D2) 1.25 MG (50000 UT) capsule   ? oxyCODONE-acetaminophen (PERCOCET) 7.5-325 MG tablet   ?  ?No orders of the defined types were placed in this encounter. ? ?Orders Placed This Encounter  ?Procedures  ? EKG 12-Lead  ? ? ?Recommendations:  ? ?Steven [name] is a 56 y.o.male patient from [place] with hypertension, hyperglycemia, mild hyperlipidemia, overweight, elevated coronary calcium score, And seen him 3 months ago and he had developed Achilles tendinitis bilaterally, blood pressure was elevated he now presents for follow-up.  He is asymptomatic. ? ?Upon review, he has had tendinitis even prior to starting statin therapy and hence statins are not responsible for his tendinitis.  He remains asymptomatic, normal physical exam, he has maintained weight loss.  He is labs reviewed, he is due for complete physical examination with Dr. Maudie Mercury checking and will obtain labs there.  As his blood pressure is well controlled, lipids are at goal, primary prevention again discussed with him and his wife Dr. Jana Hakim regarding metabolic syndrome.  All questions answered, he would like to see me back on an annual basis.  I do not make any changes to his medications. ? ? ?Adrian Prows, MD, [facility] ?06/23/2021, 5:16 PM ?Office: [phone] ? ?

## 2022-04-27 IMAGING — CT CT CARDIAC CORONARY ARTERY CALCIUM SCORE
3 series · 14 of 20 positions shown, 16 images · non-contrast
Comparison: None.

CLINICAL DATA: Screening

EXAM:
CT CARDIAC CORONARY ARTERY CALCIUM SCORE
TECHNIQUE: Non-contrast imaging through the heart was performed using
prospective ECG gating. Image post processing was performed on an
independent workstation, allowing for quantitative analysis of the
heart and coronary arteries. Note that this exam targets the heart
and the chest was not imaged in its entirety.

[Series 2: calcium scoring 2.00 qr36 bestdiast 69% hrt calciu · axial · 0.41mm/px · z∈[+1774,+1870]mm · 4 of 80 slices shown]
[im 16/80  vessel]
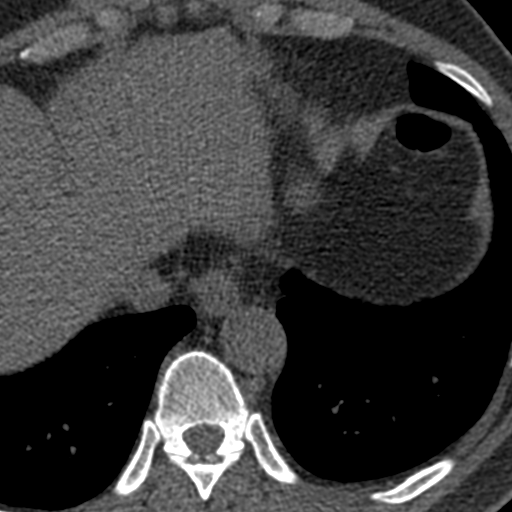
[im 32/80  vessel]
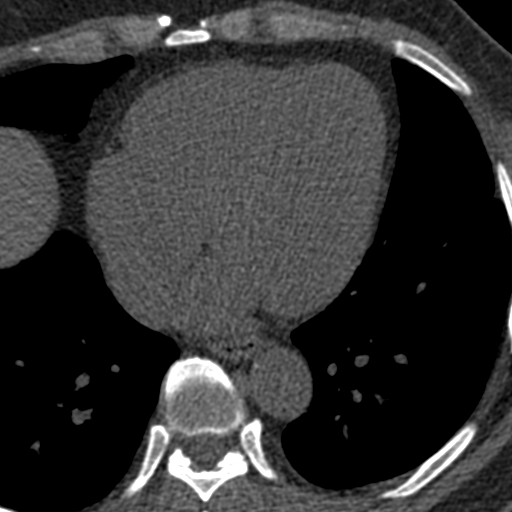
[im 48/80  vessel]
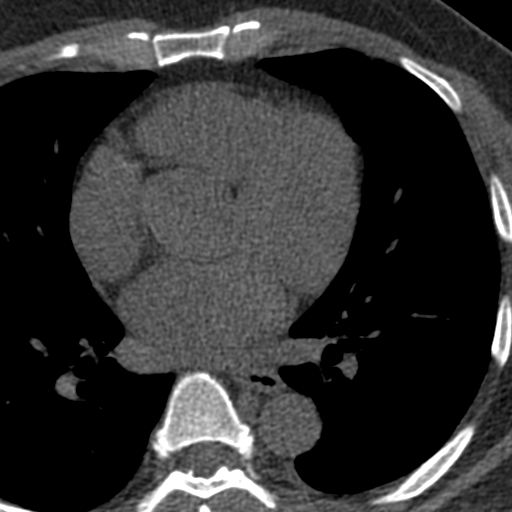
[im 64/80  vessel]
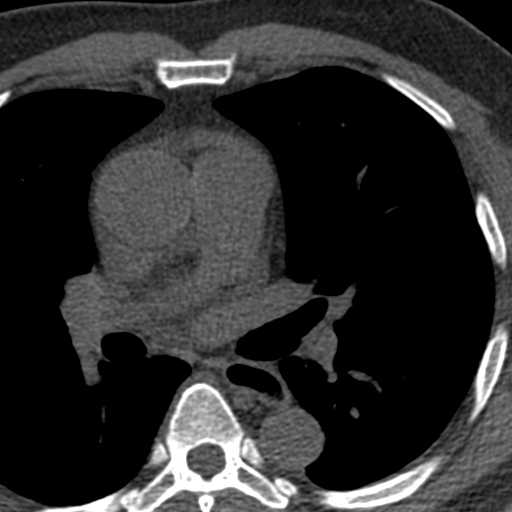

[Series 3: calcium scoring 2.00 br40 bestdiast 69% axial · axial · 0.59mm/px · z∈[+1770,+1874]mm · 5 of 80 slices shown, 7 images]
[im 14/80  vessel]
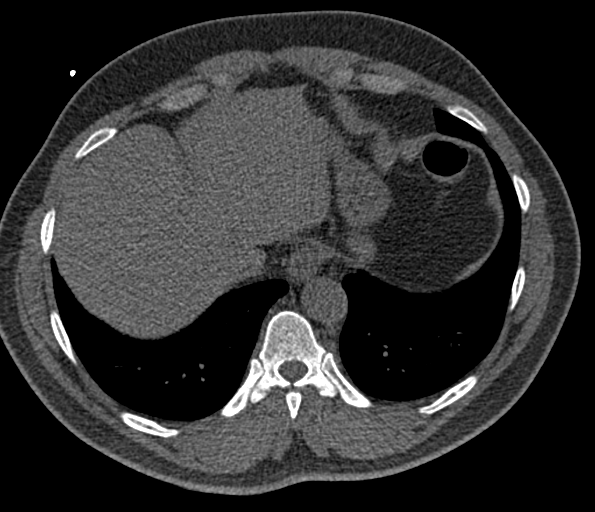
[im 14/80  lung]
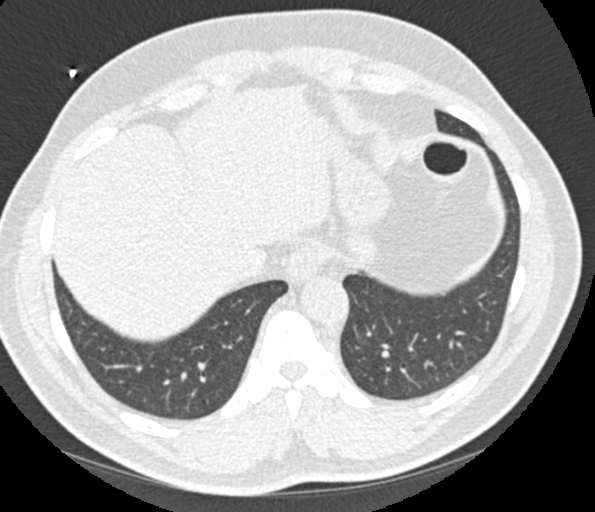
[im 27/80  vessel]
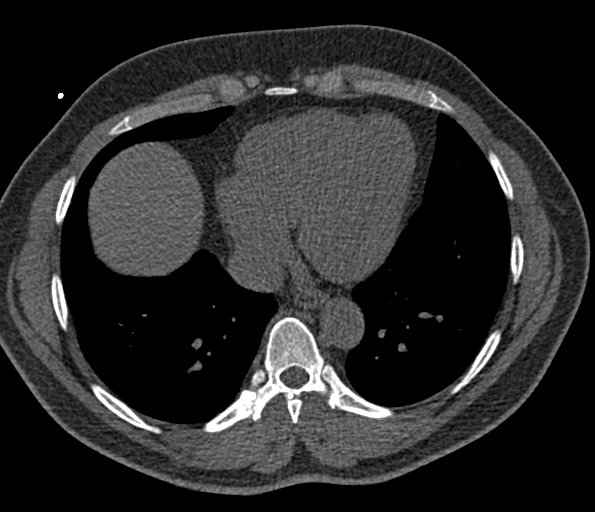
[im 40/80  vessel]
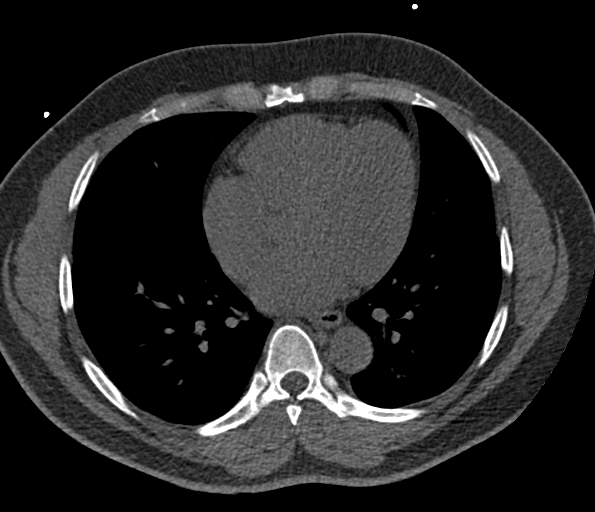
[im 53/80  vessel]
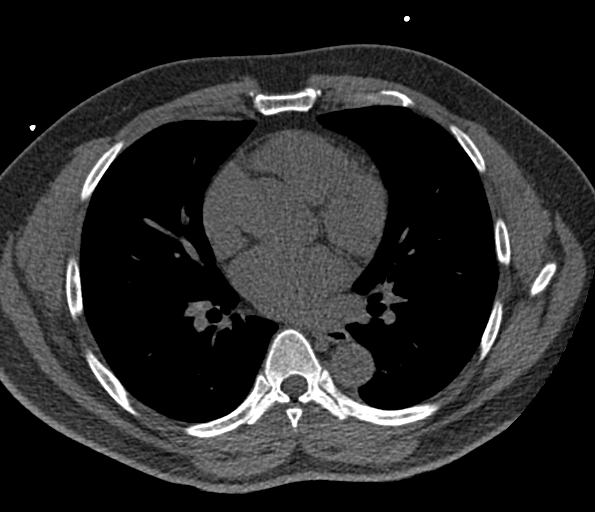
[im 66/80  vessel]
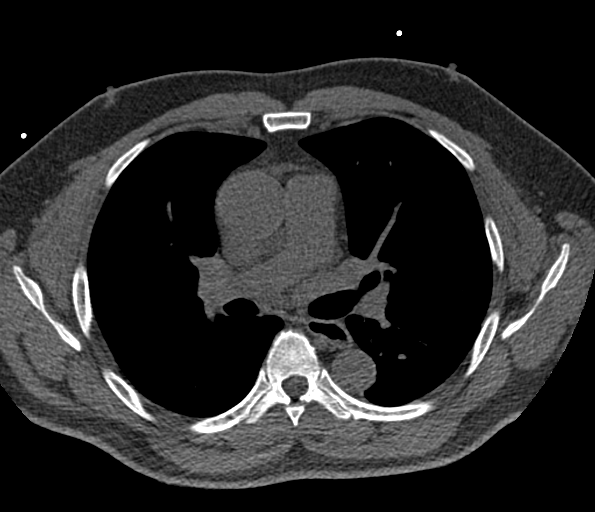
[im 66/80  lung]
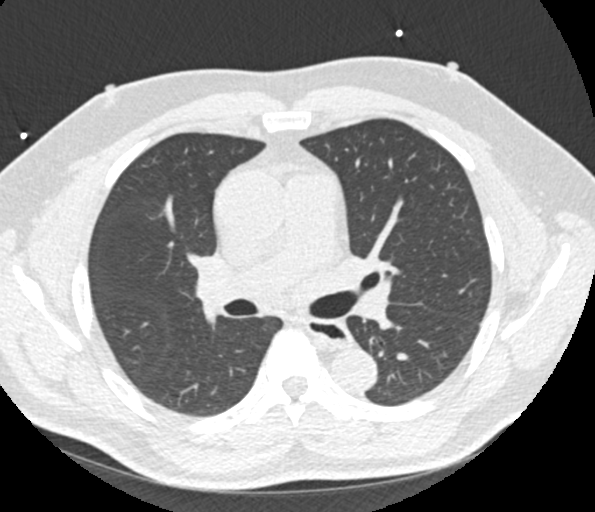

[Series 9: calcium scoring 2.00 br60 bestdiast 69% lungs · axial · 0.58mm/px · z∈[+1770,+1874]mm · 5 of 80 slices shown]
[im 14/80  vessel]
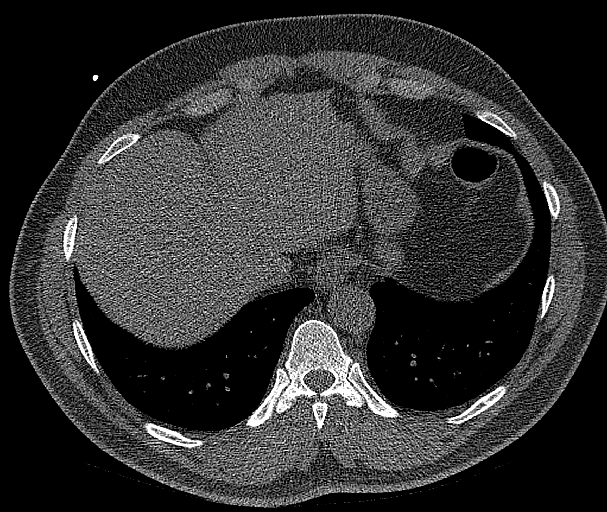
[im 27/80  vessel]
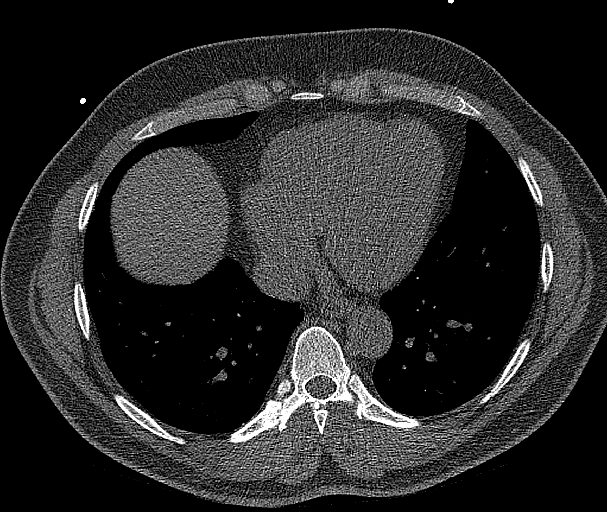
[im 40/80  vessel]
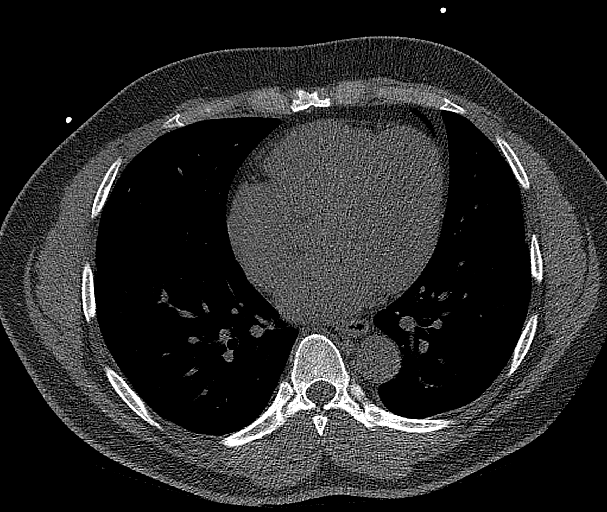
[im 53/80  vessel]
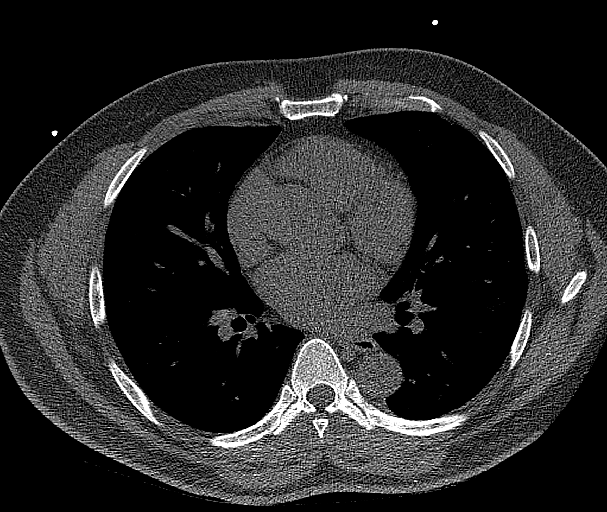
[im 66/80  vessel]
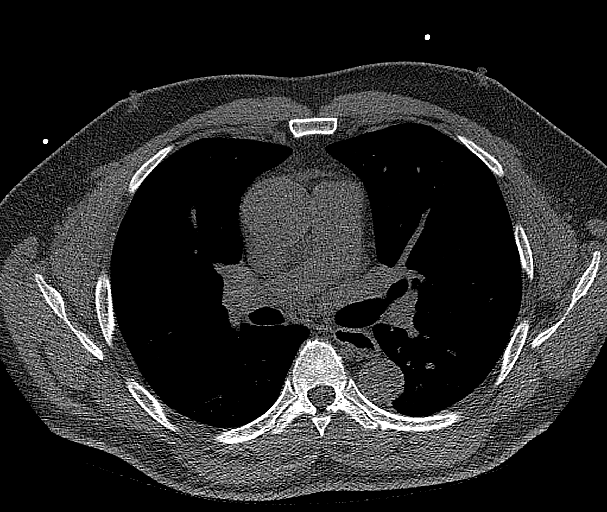

[14 of 20 positions shown; findings below may reference images not displayed]

FINDINGS: CORONARY CALCIUM SCORES:

Left Main: 0

LAD: 31

LCx: 0

RCA: 0

Total Agatston Score: 31

[HOSPITAL] percentile: 82

AORTA MEASUREMENTS:

Ascending Aorta: 36 mm

Descending Aorta: 25 mm

OTHER FINDINGS:

Heart is normal size. Aorta normal caliber. No adenopathy. No
confluent opacities or effusions. Imaging into the upper abdomen
demonstrates no acute findings. Chest wall soft tissues are
unremarkable. No acute bony abnormality.
IMPRESSION: Total Agatston score: 31

[HOSPITAL] percentile: 82

No acute or significant extracardiac abnormality.

## 2022-06-24 ENCOUNTER — Ambulatory Visit: Payer: BC Managed Care – PPO | Admitting: Cardiology

## 2022-06-24 ENCOUNTER — Encounter: Payer: Self-pay | Admitting: Cardiology

## 2022-06-24 VITALS — BP 127/74 | HR 57 | Resp 16 | Ht 72.0 in | Wt 209.6 lb

## 2022-06-24 DIAGNOSIS — K219 Gastro-esophageal reflux disease without esophagitis: Secondary | ICD-10-CM

## 2022-06-24 DIAGNOSIS — I10 Essential (primary) hypertension: Secondary | ICD-10-CM

## 2022-06-24 DIAGNOSIS — R739 Hyperglycemia, unspecified: Secondary | ICD-10-CM

## 2022-06-24 DIAGNOSIS — R931 Abnormal findings on diagnostic imaging of heart and coronary circulation: Secondary | ICD-10-CM

## 2022-06-24 DIAGNOSIS — E7800 Pure hypercholesterolemia, unspecified: Secondary | ICD-10-CM

## 2022-06-24 MED ORDER — OMEPRAZOLE 20 MG PO CPDR: 20.0000 mg | DELAYED_RELEASE_CAPSULE | Freq: Every day | ORAL | 0 refills | Status: AC

## 2022-06-24 NOTE — Progress Notes (Signed)
Primary Physician/Referring:  [name], MD  Patient ID: Steven [name], male    DOB: [birth year], 57 y.o.   MRN: [id number]  No chief complaint on file.  HPI:    Steven [name]  is a 56 y.o. male patient from [place] with hypertension, hyperglycemia, mild hyperlipidemia, overweight, elevated coronary calcium score.  This is annual visit.  He is asymptomatic.  He has maintained weight loss.  Past Medical History:  Diagnosis Date   Hyperlipidemia    Hypertension    No past surgical history on file. Family History  Problem Relation Age of Onset   Heart attack Mother 36   Glaucoma Mother 46    Social History   Tobacco Use   Smoking status: Never   Smokeless tobacco: Never  Substance Use Topics   Alcohol use: Never   Marital Status: Married  ROS  Review of Systems  Cardiovascular:  Negative for chest pain, dyspnea on exertion and leg swelling.  Gastrointestinal:  Negative for melena.   Objective  Blood pressure 127/74, pulse (!) 57, resp. rate 16, height 6' (1.829 m), weight 209 lb 9.6 oz (95.1 kg), SpO2 98 %. Body mass index is 28.43 kg/m.     06/24/2022    3:28 PM 06/22/2021    4:15 PM 03/17/2021    4:12 PM  Vitals with BMI  Height 6\' 0"  6\' 0"  6\' 0"   Weight 209 lbs 10 oz 205 lbs 10 oz 205 lbs 10 oz  BMI 28.42 0000000 0000000  Systolic [id number]  Diastolic 74 67 95  Pulse 57 77 70    Physical Exam Neck:     Vascular: No carotid bruit or JVD.  Cardiovascular:     Rate and Rhythm: Normal rate and regular rhythm.     Pulses: Intact distal pulses.     Heart sounds: Normal heart sounds. No murmur heard.    No gallop.  Pulmonary:     Effort: Pulmonary effort is normal.     Breath sounds: Normal breath sounds.  Abdominal:     General: Bowel sounds are normal.     Palpations: Abdomen is soft.  Musculoskeletal:     Right lower leg: No edema.     Left lower leg: No edema.     Laboratory examination:   External labs:   Labs 08/24/2021:  TSH  normal at 1.270.  Vitamin D 31.8.  A1c 5.9%.  April be/A10.7, normal.  BUN 15, creatinine 1.43, EGFR 58 mL, potassium 4.4.  Total cholesterol 207, triglycerides 116, HDL 66, LDL 121.   Labs 08/23/2020:  Total cholesterol 215, triglycerides 125, HDL 66, LDL 127.  Serum glucose 80 mg, BUN 15, creatinine 1.41, EGFR 60 mL, potassium 4.6.  CMP otherwise normal.  Hb 14.1/HCT 40.5, platelets 341.  April protein B/A-1 ratio 0.7, normal.  A1c 6.2%.  TSH normal at 1.150.  Medications and allergies   Allergies  Allergen Reactions   Latex Swelling    Medication list after today's encounter    Current Outpatient Medications:    atenolol (TENORMIN) 50 MG tablet, TAKE 1 TABLET(50 MG) BY MOUTH EVERY MORNING, Disp: 90 tablet, Rfl: 0   atorvastatin (LIPITOR) 20 MG tablet, TAKE 1 TABLET(20 MG) BY MOUTH DAILY, Disp: 90 tablet, Rfl: 0   Azilsartan-Chlorthalidone (EDARBYCLOR) 40-25 MG TABS, Take 1 tablet by mouth every morning., Disp: 90 tablet, Rfl: 1   cholecalciferol (VITAMIN D3) 25 MCG (1000 UNIT) tablet, Take 1,000 Units by mouth daily., Disp: , Rfl:  spironolactone (ALDACTONE) 25 MG tablet, TAKE 1 TABLET(25 MG) BY MOUTH DAILY. FOLLOW UP APPOINTMENT FOR FURTHER REFILLS, Disp: 30 tablet, Rfl: 0   verapamil (VERELAN PM) 240 MG 24 hr capsule, Take 1 capsule by mouth daily., Disp: , Rfl:   Radiology:   No results found.  Cardiac Studies:   Echocardiogram 12/28/2016:  Left ventricle cavity is normal in size. Moderate concentric hypertrophy of the left ventricle. Normal global wall motion. Doppler evidence of grade I (impaired) diastolic dysfunction, normal LAP. Calculated EF 55%. Left atrial cavity is mildly dilated. Mild (Grade I) aortic regurgitation. Mild (Grade I) mitral regurgitation. Mild tricuspid regurgitation.  No evidence of pulmonary hypertension.  Coronary calcium score 11/28/2020: LM 0 LAD 31 LCx 0 RCA 0 Total Agatston score 31. MESA database percentile: 82 Ascending  and descending thoracic aorta are normal in size, no significant extracardiac abnormality.  EKG:   EKG 06/24/2022: Marked sinus bradycardia at rate of 49 bpm otherwise normal EKG.  Compared to 06/22/2021, nonspecific T wave flattening not present.  Assessment     ICD-10-CM   1. Hypercholesteremia  E78.00     2. Hyperglycemia  R73.9     3. Primary hypertension  I10 EKG 12-Lead    4. Agatston CAC score, <100, 89th percentile. Sept 2022  R93.1       There are no discontinued medications.   No orders of the defined types were placed in this encounter.  Orders Placed This Encounter  Procedures   EKG 12-Lead    Recommendations:   Steven [name] is a 57 y.o.male patient from [place] with hypertension, hyperglycemia, mild hyperlipidemia, overweight, elevated coronary calcium score presents for annual visit.  He is asymptomatic except he has developed GERD.  1. Hypercholesteremia I reviewed his lipids, he is now on a statin, LDL goal <100, closer to 70 if possible, he has an appointment to see Dr. Aggie Hacker in the next 2 months where he will be obtaining complete labs.  I did not make any changes to his medications.  2. Hyperglycemia He does have hyperglycemia but no diabetes mellitus.  He is continue to lose weight.  He is trying to achieve his weight around 190 pounds.  This should also help with improvement in hyperglycemia.  3. Primary hypertension Blood pressure under excellent control, he is on ARB, diuretics, low-dose beta-blocker along with spironolactone.  If he develops mastodynia or gynecomastia, we may have to change his spironolactone to eplerenone.  Patient is aware of this.  Presently he is not having any side effects.  4. Agatston CAC score, <100, 89th percentile. Sept 2022 In view of high coronary calcium score, aggressive risk factor modification and primary prevention is indicated.  Patient is aware of this.  He remained stable without any symptoms of angina or  chest pain or shortness of breath, hence I will see him back on a as needed basis.  5. Gastroesophageal reflux disease without esophagitis I prescribed him omeprazole, he will take it on a as needed basis.   Adrian Prows, MD, [facility] 06/24/2022, 3:42 PM Office: [phone]

## 2022-08-17 ENCOUNTER — Ambulatory Visit (HOSPITAL_COMMUNITY)
Admission: EM | Admit: 2022-08-17 | Discharge: 2022-08-17 | Disposition: A | Discharge status: Home / Self Care | Payer: Self-pay | Attending: Nurse Practitioner | Admitting: Nurse Practitioner

## 2022-08-17 ENCOUNTER — Encounter (HOSPITAL_COMMUNITY): Payer: Self-pay

## 2022-08-17 ENCOUNTER — Ambulatory Visit (HOSPITAL_COMMUNITY): Payer: No Typology Code available for payment source

## 2022-08-17 VITALS — BP 135/89 | HR 64 | Temp 97.9°F | Resp 16

## 2022-08-17 DIAGNOSIS — S62115A Nondisplaced fracture of triquetrum [cuneiform] bone, left wrist, initial encounter for closed fracture: Secondary | ICD-10-CM

## 2022-08-17 MED ORDER — IBUPROFEN 800 MG PO TABS
800.0000 mg | ORAL_TABLET | Freq: Once | ORAL | Status: AC
  Administered 2022-08-17: 800 mg via ORAL

## 2022-08-17 MED ORDER — IBUPROFEN 800 MG PO TABS
ORAL_TABLET | ORAL | Status: AC
  Filled 2022-08-17: qty 1

## 2022-08-17 MED ORDER — IBUPROFEN 600 MG PO TABS: 600.0000 mg | ORAL_TABLET | Freq: Four times a day (QID) | ORAL | 0 refills | Status: AC | PRN

## 2022-08-17 NOTE — ED Notes (Signed)
Msg left for ortho tech for splint request.

## 2022-08-17 NOTE — Discharge Instructions (Addendum)
You have been seen for a fracture (break) in your left wrist.   You have been placed in a splint.  Do not remove the splint.  Keep the splint clean and dry. Cover it with a watertight covering when you take a bath or a shower. Move your fingers often to reduce stiffness and swelling. Raise (elevate) the injured area above the level of your heart while you are sitting or lying down. Take medications as needed

## 2022-08-17 NOTE — ED Notes (Addendum)
Ortho tech paged, and notified of splint request.

## 2022-08-17 NOTE — Progress Notes (Signed)
Orthopedic Tech Progress Note Patient Details:  Steven [name] 01/18/1966 [id number]  Volar Splint Applied in UC. Ortho Devices Type of Ortho Device: Volar splint Ortho Device/Splint Location: LUE Ortho Device/Splint Interventions: Ordered, Application, Adjustment   Post Interventions Patient Tolerated: Well Instructions Provided: Adjustment of device, Care of device  Sherilyn Banker 08/17/2022, 8:02 PM

## 2022-08-17 NOTE — ED Triage Notes (Signed)
Reports sitting on broken chair while at work 2 days ago, falling onto left hand/wrist. C/O continued pain to left wrist. LUE CMS intact. Has been taking Advil. A few small scratches and small abrasion noted to left posterior medial wrist area.

## 2022-08-17 NOTE — ED Provider Notes (Signed)
[facility]    CSN: [id number] Arrival date & time: 08/17/22  1654      History   Chief Complaint Chief Complaint  Patient presents with   Appt    1700   Wrist Injury    HPI Steven [name] is a 57 y.o. male.   History of Present Illness  Steven [name] is a 57 y.o. male that complains of pain in the left wrist pain after a fall. Onset of symptoms was  2 days ago. Patient reports that a chair that he was trying to sit in broke and caused him to fall. He thinks that he used his wrist to break the fall. There is is not a history of a previous wrist injury. Patient describes pain as aching and throbbing. Pain severity now is 9 /10. The pain does not radiate. Pain is aggravated by movement, use, and palpation. Pain is alleviated by NSAIDS. Symptoms associated with pain include some tingling. He denies numbness, weakness, loss of sensation, loss of motion, or inability to bear weight. Patient also has some abrasions to the wrist. The patient denies other injuries.      Past Medical History:  Diagnosis Date   Hyperlipidemia    Hypertension     There are no problems to display for this patient.   History reviewed. No pertinent surgical history.     Home Medications    Prior to Admission medications   Medication Sig Start Date End Date Taking? Authorizing Provider  atenolol (TENORMIN) 50 MG tablet TAKE 1 TABLET(50 MG) BY MOUTH EVERY MORNING 04/14/21  Yes Yates Decamp, MD  atorvastatin (LIPITOR) 20 MG tablet TAKE 1 TABLET(20 MG) BY MOUTH DAILY 04/14/21  Yes Yates Decamp, MD  Azilsartan-Chlorthalidone (EDARBYCLOR) 40-25 MG TABS Take 1 tablet by mouth every morning. 03/17/21  Yes Yates Decamp, MD  cholecalciferol (VITAMIN D3) 25 MCG (1000 UNIT) tablet Take 1,000 Units by mouth daily.   Yes [provider]  ibuprofen (ADVIL) 600 MG tablet Take 1 tablet (600 mg total) by mouth every 6 (six) hours as needed. 08/17/22  Yes Lurline Idol, FNP  omeprazole  (PRILOSEC) 20 MG capsule Take 1 capsule (20 mg total) by mouth daily. 06/24/22  Yes Yates Decamp, MD  spironolactone (ALDACTONE) 25 MG tablet TAKE 1 TABLET(25 MG) BY MOUTH DAILY. FOLLOW UP APPOINTMENT FOR FURTHER REFILLS 01/15/20  Yes Yates Decamp, MD  verapamil (VERELAN PM) 240 MG 24 hr capsule Take 1 capsule by mouth daily. 01/06/18  Yes [provider]    Family History Family History  Problem Relation Age of Onset   Heart attack Mother 33   Glaucoma Mother 42    Social History Social History   Tobacco Use   Smoking status: Never   Smokeless tobacco: Never  Vaping Use   Vaping Use: Never used  Substance Use Topics   Alcohol use: Never   Drug use: Never     Allergies   Latex   Review of Systems Review of Systems  Musculoskeletal:  Positive for arthralgias.  Skin:  Positive for wound.  Neurological:  Negative for weakness and numbness.  All other systems reviewed and are negative.    Physical Exam Triage Vital Signs ED Triage Vitals  Enc Vitals Group     BP 08/17/22 1747 135/89     Pulse Rate 08/17/22 1747 64     Resp 08/17/22 1747 16     Temp 08/17/22 1747 97.9 F (36.6 C)     Temp Source  08/17/22 1747 Oral     SpO2 08/17/22 1747 98 %     Weight --      Height --      Head Circumference --      Peak Flow --      Pain Score 08/17/22 1746 9     Pain Loc --      Pain Edu? --      Excl. in GC? --    No data found.  Updated Vital Signs BP 135/89   Pulse 64   Temp 97.9 F (36.6 C) (Oral)   Resp 16   SpO2 98%   Visual Acuity Right Eye Distance:   Left Eye Distance:   Bilateral Distance:    Right Eye Near:   Left Eye Near:    Bilateral Near:     Physical Exam Vitals reviewed.  Constitutional:      Appearance: Normal appearance.  HENT:     Head: Normocephalic.  Cardiovascular:     Rate and Rhythm: Normal rate.  Pulmonary:     Effort: Pulmonary effort is normal.  Musculoskeletal:     Left wrist: Swelling and tenderness present. No  deformity, lacerations, bony tenderness, snuff box tenderness or crepitus. Normal range of motion. Normal pulse.     Cervical back: Normal range of motion and neck supple.  Skin:    General: Skin is warm and dry.     Findings: Abrasion present.     Comments: Abrasions noted to the left wrist   Neurological:     General: No focal deficit present.     Mental Status: He is alert and oriented to person, place, and time.      UC Treatments / Results  Labs (all labs ordered are listed, but only abnormal results are displayed) Labs Reviewed - No data to display  EKG   Radiology DG Wrist Complete Left  Result Date: 08/17/2022 CLINICAL DATA:  Fall EXAM: LEFT WRIST - COMPLETE 3+ VIEW COMPARISON:  None Available. FINDINGS: There is a questionable triquetral fracture seen on the lateral view. There is no evidence of arthropathy or other focal bone abnormality. There is soft tissue swelling surrounding the wrist. IMPRESSION: Questionable triquetral fracture seen on the lateral view. Recommend correlation with point tenderness. Electronically Signed   By: Darliss Cheney M.D.   On: 08/17/2022 18:29    Procedures Procedures (including critical care time)  Medications Ordered in UC Medications  ibuprofen (ADVIL) tablet 800 mg (800 mg Oral Given 08/17/22 1922)    Initial Impression / Assessment and Plan / UC Course  I have reviewed the triage vital signs and the nursing notes.  Pertinent labs & imaging results that were available during my care of the patient were reviewed by me and considered in my medical decision making (see chart for details).    57 yo male presenting with left wrist pain after a fall two days ago. Physical exam as above. XR shows questionable triquetral fracture. Patient placed in volar splint by ortho tech. LUE is neurovascularly intact. Motrin 800 mg po given in clinic. Patient prescribed motrin as needed for pain. He should follow-up with occupational health for further  management of his injury.  Today's evaluation has revealed no signs of a dangerous process. Discussed diagnosis with patient and/or guardian. Patient and/or guardian aware of their diagnosis, possible red flag symptoms to watch out for and need for close follow up. Patient and/or guardian understands verbal and written discharge instructions. Patient and/or guardian comfortable with  plan and disposition.  Patient and/or guardian has a clear mental status at this time, good insight into illness (after discussion and teaching) and has clear judgment to make decisions regarding their care  Documentation was completed with the aid of voice recognition software. Transcription may contain typographical errors. Final Clinical Impressions(s) / UC Diagnoses   Final diagnoses:  Closed nondisplaced fracture of triquetrum of left wrist, initial encounter     Discharge Instructions      You have been seen for a fracture (break) in your left wrist.   You have been placed in a splint.  Do not remove the splint.  Keep the splint clean and dry. Cover it with a watertight covering when you take a bath or a shower. Move your fingers often to reduce stiffness and swelling. Raise (elevate) the injured area above the level of your heart while you are sitting or lying down. Take medications as needed       ED Prescriptions     Medication Sig Dispense Auth. Provider   ibuprofen (ADVIL) 600 MG tablet Take 1 tablet (600 mg total) by mouth every 6 (six) hours as needed. 30 tablet Lurline Idol, FNP      PDMP not reviewed this encounter.   Lurline Idol, [place] 08/17/22 1942

## 2022-11-18 ENCOUNTER — Other Ambulatory Visit (HOSPITAL_COMMUNITY): Payer: Self-pay | Admitting: Orthopedic Surgery

## 2022-11-18 DIAGNOSIS — M25532 Pain in left wrist: Secondary | ICD-10-CM

## 2022-11-24 ENCOUNTER — Ambulatory Visit (HOSPITAL_COMMUNITY)
Admission: RE | Admit: 2022-11-24 | Discharge: 2022-11-24 | Disposition: A | Discharge status: Home / Self Care | Payer: PRIVATE HEALTH INSURANCE | Source: Ambulatory Visit | Attending: Orthopedic Surgery | Admitting: Orthopedic Surgery

## 2022-11-24 DIAGNOSIS — M25532 Pain in left wrist: Secondary | ICD-10-CM | POA: Insufficient documentation

## 2022-11-26 ENCOUNTER — Ambulatory Visit (HOSPITAL_COMMUNITY): Payer: Self-pay
